# Patient Record
Sex: Male | Born: 1955 | Race: Black or African American | Hispanic: No | Marital: Single | Smoking: Never smoker
Health system: Southern US, Community
[De-identification: ages and names within clinical notes are randomized; demographics above are authoritative.]

---

## 1998-02-02 ENCOUNTER — Ambulatory Visit (HOSPITAL_COMMUNITY): Admission: RE | Admit: 1998-02-02 | Discharge: 1998-02-02 | Payer: Self-pay | Admitting: Family Medicine

## 1998-02-02 ENCOUNTER — Encounter: Payer: Self-pay | Admitting: Family Medicine

## 2000-05-11 ENCOUNTER — Encounter: Payer: Self-pay | Admitting: General Surgery

## 2000-05-11 ENCOUNTER — Encounter: Admission: RE | Admit: 2000-05-11 | Discharge: 2000-05-11 | Payer: Self-pay | Admitting: General Surgery

## 2007-12-16 ENCOUNTER — Encounter: Admission: RE | Admit: 2007-12-16 | Discharge: 2007-12-16 | Payer: Self-pay | Admitting: Chiropractic Medicine

## 2008-11-21 ENCOUNTER — Emergency Department (HOSPITAL_COMMUNITY): Admission: EM | Admit: 2008-11-21 | Discharge: 2008-11-22 | Payer: Self-pay | Admitting: Emergency Medicine

## 2010-05-20 ENCOUNTER — Emergency Department (HOSPITAL_COMMUNITY)
Admission: EM | Admit: 2010-05-20 | Discharge: 2010-05-20 | Payer: Self-pay | Source: Home / Self Care | Admitting: Emergency Medicine

## 2010-07-26 ENCOUNTER — Emergency Department (HOSPITAL_COMMUNITY)
Admission: EM | Admit: 2010-07-26 | Discharge: 2010-07-26 | Disposition: A | Discharge status: Home / Self Care | Payer: Self-pay | Attending: Emergency Medicine | Admitting: Emergency Medicine

## 2010-07-26 ENCOUNTER — Emergency Department (HOSPITAL_COMMUNITY): Payer: Self-pay

## 2010-07-26 DIAGNOSIS — M25569 Pain in unspecified knee: Secondary | ICD-10-CM | POA: Insufficient documentation

## 2010-07-26 DIAGNOSIS — Y929 Unspecified place or not applicable: Secondary | ICD-10-CM | POA: Insufficient documentation

## 2010-07-26 DIAGNOSIS — Y9355 Activity, bike riding: Secondary | ICD-10-CM | POA: Insufficient documentation

## 2010-07-26 DIAGNOSIS — M25579 Pain in unspecified ankle and joints of unspecified foot: Secondary | ICD-10-CM | POA: Insufficient documentation

## 2010-07-26 DIAGNOSIS — IMO0002 Reserved for concepts with insufficient information to code with codable children: Secondary | ICD-10-CM | POA: Insufficient documentation

## 2010-07-26 DIAGNOSIS — M25519 Pain in unspecified shoulder: Secondary | ICD-10-CM | POA: Insufficient documentation

## 2010-07-26 DIAGNOSIS — S7000XA Contusion of unspecified hip, initial encounter: Secondary | ICD-10-CM | POA: Insufficient documentation

## 2010-07-26 DIAGNOSIS — M25559 Pain in unspecified hip: Secondary | ICD-10-CM | POA: Insufficient documentation

## 2010-07-26 DIAGNOSIS — M79609 Pain in unspecified limb: Secondary | ICD-10-CM | POA: Insufficient documentation

## 2010-08-15 LAB — POCT I-STAT, CHEM 8
BUN: 11 mg/dL (ref 6–23)
Calcium, Ion: 1.07 mmol/L — ABNORMAL LOW (ref 1.12–1.32)
Chloride: 104 mEq/L (ref 96–112)
Creatinine, Ser: 1.5 mg/dL (ref 0.4–1.5)
Glucose, Bld: 93 mg/dL (ref 70–99)
Potassium: 3.9 mEq/L (ref 3.5–5.1)

## 2010-09-12 LAB — ETHANOL: Alcohol, Ethyl (B): 217 mg/dL — ABNORMAL HIGH (ref 0–10)

## 2014-01-21 ENCOUNTER — Emergency Department (HOSPITAL_COMMUNITY)
Admission: EM | Admit: 2014-01-21 | Discharge: 2014-01-21 | Disposition: A | Discharge status: Home / Self Care | Payer: No Typology Code available for payment source | Attending: Emergency Medicine | Admitting: Emergency Medicine

## 2014-01-21 ENCOUNTER — Emergency Department (HOSPITAL_COMMUNITY): Payer: No Typology Code available for payment source

## 2014-01-21 DIAGNOSIS — S52599A Other fractures of lower end of unspecified radius, initial encounter for closed fracture: Secondary | ICD-10-CM | POA: Diagnosis not present

## 2014-01-21 DIAGNOSIS — S52502A Unspecified fracture of the lower end of left radius, initial encounter for closed fracture: Secondary | ICD-10-CM

## 2014-01-21 DIAGNOSIS — Y9389 Activity, other specified: Secondary | ICD-10-CM | POA: Diagnosis not present

## 2014-01-21 DIAGNOSIS — S6990XA Unspecified injury of unspecified wrist, hand and finger(s), initial encounter: Secondary | ICD-10-CM | POA: Diagnosis present

## 2014-01-21 DIAGNOSIS — W208XXA Other cause of strike by thrown, projected or falling object, initial encounter: Secondary | ICD-10-CM | POA: Diagnosis not present

## 2014-01-21 DIAGNOSIS — S59909A Unspecified injury of unspecified elbow, initial encounter: Secondary | ICD-10-CM | POA: Insufficient documentation

## 2014-01-21 DIAGNOSIS — S59919A Unspecified injury of unspecified forearm, initial encounter: Secondary | ICD-10-CM

## 2014-01-21 DIAGNOSIS — Y929 Unspecified place or not applicable: Secondary | ICD-10-CM | POA: Diagnosis not present

## 2014-01-21 MED ORDER — HYDROCODONE-ACETAMINOPHEN 5-325 MG PO TABS: 1.0000 | ORAL_TABLET | ORAL | Status: AC | PRN

## 2014-01-21 MED ORDER — HYDROCODONE-ACETAMINOPHEN 5-325 MG PO TABS
2.0000 | ORAL_TABLET | Freq: Once | ORAL | Status: AC
  Administered 2014-01-21: 2 via ORAL
  Filled 2014-01-21: qty 2

## 2014-01-21 NOTE — ED Notes (Signed)
MD and PA at bedside for re evaluation Ortho tech at bedside

## 2014-01-21 NOTE — ED Notes (Signed)
Fracture care discussed as well as narcotic precautions

## 2014-01-21 NOTE — Discharge Instructions (Signed)
Take Hydrocodone 1-2 pills every 4-6 hours as needed for pain.  Follow up with Hand Surgery.     Forearm Fracture Your caregiver has diagnosed you as having a broken bone (fracture) of the forearm. This is the part of your arm between the elbow and your wrist. Your forearm is made up of two bones. These are the radius and ulna. A fracture is a break in one or both bones. A cast or splint is used to protect and keep your injured bone from moving. The cast or splint will be on generally for about 5 to 6 weeks, with individual variations. HOME CARE INSTRUCTIONS   Keep the injured part elevated while sitting or lying down. Keeping the injury above the level of your heart (the center of the chest). This will decrease swelling and pain.  Apply ice to the injury for 15-20 minutes, 03-04 times per day while awake, for 2 days. Put the ice in a plastic bag and place a thin towel between the bag of ice and your cast or splint.  If you have a plaster or fiberglass cast:  Do not try to scratch the skin under the cast using sharp or pointed objects.  Check the skin around the cast every day. You may put lotion on any red or sore areas.  Keep your cast dry and clean.  If you have a plaster splint:  Wear the splint as directed.  You may loosen the elastic around the splint if your fingers become numb, tingle, or turn cold or blue.  Do not put pressure on any part of your cast or splint. It may break. Rest your cast only on a pillow the first 24 hours until it is fully hardened.  Your cast or splint can be protected during bathing with a plastic bag. Do not lower the cast or splint into water.  Only take over-the-counter or prescription medicines for pain, discomfort, or fever as directed by your caregiver. SEEK IMMEDIATE MEDICAL CARE IF:   Your cast gets damaged or breaks.  You have more severe pain or swelling than you did before the cast.  Your skin or nails below the injury turn blue or gray,  or feel cold or numb.  There is a bad smell or new stains and/or pus like (purulent) drainage coming from under the cast. MAKE SURE YOU:   Understand these instructions.  Will watch your condition.  Will get help right away if you are not doing well or get worse. Document Released: 05/19/2000 Document Revised: 08/14/2011 Document Reviewed: 01/09/2008 [facility] Patient Information 2015 [place]ollsExitCare, [place]LL[place]. This information is not intended to replace advice given to you by your health care provider. Make sure you discuss any questions you have with your health care provider.

## 2014-01-21 NOTE — ED Notes (Addendum)
Pt reports a larger mower fell on his left hand/wrist. pts left hand is noticeable swollen, reports wrist pain 10/10, able to wiggle fingers slightly. bil radial pulse strong. Pt also has quarter size knot on top of right hand.  Pt reports he has had left middle finger pain before this injury.

## 2014-01-21 NOTE — ED Provider Notes (Signed)
CSN: [id number]     Arrival date & time 01/21/14  1415 History  This chart was scribed for non-physician practitioner, Ladona Mow, PA-C working with Gerhard Munch, MD by Greggory Stallion, ED scribe. This patient was seen in room WTR8/WTR8 and the patient's care was started at 2:33 PM.   Chief Complaint  Patient presents with  . Wrist Injury   The history is provided by the patient. No language interpreter was used.   HPI Comments: Justin Ramos is a 58 y.o. male who presents to the Emergency Department complaining of left wrist injury that occurred yesterday morning around 8 AM. States the deck of a large lawnmower fell onto his hand. He had sudden onset, worsening pain with associated swelling. Making a fist and moving his wrist worsens the pain. Denies numbness or tingling. Pt smokes cigarettes daily. Denies history of diabetes. Pt reports history of problems with his left middle finger. Patient states his hand was not trapped under the lawnmower, and it was easily removed off of his wrist.  No past medical history on file. No past surgical history on file. No family history on file. History  Substance Use Topics  . Smoking status: Not on file  . Smokeless tobacco: Not on file  . Alcohol Use: Not on file    Review of Systems  Musculoskeletal: Positive for arthralgias and joint swelling.  Neurological: Negative for numbness.  All other systems reviewed and are negative.  Allergies  Review of patient's allergies indicates no known allergies.  Home Medications   Prior to Admission medications   Medication Sig Start Date End Date Taking? Authorizing Provider  HYDROcodone-acetaminophen (NORCO/VICODIN) 5-325 MG per tablet Take 1-2 tablets by mouth every 4 (four) hours as needed for moderate pain or severe pain. 01/21/14   Monte Fantasia, PA-C   BP 149/95  Pulse 93  Temp(Src) 98.4 F (36.9 C) (Oral)  Resp 16  SpO2 98%  Physical Exam  Nursing note and vitals  reviewed. Constitutional: He is oriented to person, place, and time. He appears well-developed and well-nourished. No distress.  HENT:  Head: Normocephalic and atraumatic.  Eyes: Conjunctivae and EOM are normal.  Neck: Neck supple. No tracheal deviation present.  Cardiovascular: Normal rate.   Pulmonary/Chest: Effort normal. No respiratory distress.  Musculoskeletal:       Left wrist: He exhibits decreased range of motion, tenderness, bony tenderness and swelling. He exhibits no crepitus, no deformity and no laceration.  Moderate swelling noted to left hand, left wrist. No erythema, ecchymosis, cyanosis, signs of compartment syndrome. Patient is able to flex and extend all 5 digits, with limited range of motion due to to pain and swelling. Distal cap refill less than 2 seconds. Radial pulse 2+. Distal sensation intact. Patient has pain with range of motion flexion extension, abduction adduction of his wrist. Patient has severe pain with supination of his wrist.  Neurological: He is alert and oriented to person, place, and time. He has normal strength. No sensory deficit.  Skin: Skin is warm and dry.  Psychiatric: He has a normal mood and affect. His behavior is normal.    ED Course  Procedures (including critical care time)  DIAGNOSTIC STUDIES: Oxygen Saturation is 98% on RA, normal by my interpretation.    COORDINATION OF CARE: 2:37 PM-Discussed treatment plan which includes xray and pain medication with pt at bedside and pt agreed to plan.   Labs Review Labs Reviewed - No data to display  Imaging Review Dg Wrist Complete  Left  01/21/2014   CLINICAL DATA:  Hand and wrist pain and swelling following trauma.  EXAM: LEFT WRIST - COMPLETE 3+ VIEW  COMPARISON:  Left hand film of today's date  FINDINGS: The bones are mildly osteopenic. There is irregularity of the mineralization of the distal radial metaphysis in there is an avulsion from its dorsal surface. The findings are consistent with  an impacted fracture. As well as the avulsion. The adjacent ulna is intact. The carpal bones and the proximal metacarpals are intact.  IMPRESSION: The patient has sustained an acute impacted fracture with dorsal avulsion of the distal left radial metaphysis.   Electronically Signed   By: David  Swazil[place]Jordan   On: 01/21/2014 15:02   Dg Hand Complete Left  01/21/2014   CLINICAL DATA:  Status post blunt trauma to the left wrist and hand  EXAM: LEFT HAND - COMPLETE 3+ VIEW  COMPARISON:  Left wrist series of today's date  FINDINGS: The bones are mildly osteopenic. There is mild degenerative change of the second and third metacarpophalangeal joints with minimal interphalangeal joint changes at multiple sites. There is no acute fracture nor dislocation. The carpometacarpal joints are normal. There is a fracture of the dorsal aspect of the distal radius which is likely avulsion type in nature. The distal ulna is intact.  IMPRESSION: There is an avulsion from the dorsal aspect of the distal left radial metaphysis. No fracture of the carpal bones or bones of the hand are demonstrated. 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   Electronically Signed   By: David  [place]   On: 01/21/2014 15:00     EKG Interpretation None      MDM   Final diagnoses:  Fracture of radius, distal, left, closed, initial encounter    58 year old male complaining of left wrist pain after a riding lawn mower deck fell on his hand. Workup to include radiographs of patient's left wrist and pain management.  3:00 PM: Radiograph of left wrist return with impression of sustained acute impacted fracture with dorsal avulsion of the distal left radial metaphysis. We will have [facility] place a volar splint on patient's left forearm.  4:00 PM: Patient reports his pain is controlled at this time. [facility] place a volar splint. We will discharge patient and have him follow with Dr. Mina MarbleWeingold with hand surgery for further evaluation. We encouraged  patient to call or return to the ER should his symptoms worsen, should he develop any swelling, numbness, tingling, loss of function of his hand.    Signed,  Ladona MowJoe Joseguadalupe Stan, PA-C 9:09 PM   I personally performed the services described in this documentation, which was scribed in my presence. The recorded information has been reviewed and is accurate.  Monte FantasiaJoseph W Kortne All, PA-C 01/21/14 2110

## 2014-01-22 NOTE — ED Provider Notes (Signed)
  This was a shared visit with a mid-level provided (NP or PA).  Throughout the patient's course I was available for consultation/collaboration.  I saw the ECG (if appropriate), relevant labs and studies - I agree with the interpretation.  On my exam the patient was in no distress.  However he had impressive swelling about the dorsum of his left hand. Patient is distally neurovascularly intact.      Gerhard Munchobert Benjamin Merrihew, MD 01/22/14 [phone]

## 2014-11-09 ENCOUNTER — Emergency Department (HOSPITAL_COMMUNITY)
Admission: EM | Admit: 2014-11-09 | Discharge: 2014-11-09 | Disposition: A | Discharge status: Home / Self Care | Payer: No Typology Code available for payment source | Attending: Emergency Medicine | Admitting: Emergency Medicine

## 2014-11-09 ENCOUNTER — Encounter (HOSPITAL_COMMUNITY): Payer: Self-pay | Admitting: Emergency Medicine

## 2014-11-09 DIAGNOSIS — K047 Periapical abscess without sinus: Secondary | ICD-10-CM | POA: Insufficient documentation

## 2014-11-09 DIAGNOSIS — K029 Dental caries, unspecified: Secondary | ICD-10-CM | POA: Insufficient documentation

## 2014-11-09 MED ORDER — IBUPROFEN 600 MG PO TABS: 600.0000 mg | ORAL_TABLET | Freq: Three times a day (TID) | ORAL | Status: AC

## 2014-11-09 MED ORDER — PENICILLIN V POTASSIUM 500 MG PO TABS: 500.0000 mg | ORAL_TABLET | Freq: Four times a day (QID) | ORAL | Status: AC

## 2014-11-09 MED ORDER — IBUPROFEN 200 MG PO TABS
600.0000 mg | ORAL_TABLET | Freq: Once | ORAL | Status: AC
  Administered 2014-11-09: 600 mg via ORAL
  Filled 2014-11-09: qty 3

## 2014-11-09 MED ORDER — PENICILLIN V POTASSIUM 500 MG PO TABS
500.0000 mg | ORAL_TABLET | Freq: Once | ORAL | Status: AC
  Administered 2014-11-09: 500 mg via ORAL
  Filled 2014-11-09: qty 1

## 2014-11-09 NOTE — Discharge Instructions (Signed)
Dental Abscess A dental abscess is a collection of infected fluid (pus) from a bacterial infection in the inner part of the tooth (pulp). It usually occurs at the end of the tooth's root.  CAUSES   Severe tooth decay.  Trauma to the tooth that allows bacteria to enter into the pulp, such as a broken or chipped tooth. SYMPTOMS   Severe pain in and around the infected tooth.  Swelling and redness around the abscessed tooth or in the mouth or face.  Tenderness.  Pus drainage.  Bad breath.  Bitter taste in the mouth.  Difficulty swallowing.  Difficulty opening the mouth.  Nausea.  Vomiting.  Chills.  Swollen neck glands. DIAGNOSIS   A medical and dental history will be taken.  An examination will be performed by tapping on the abscessed tooth.  X-rays may be taken of the tooth to identify the abscess. TREATMENT The goal of treatment is to eliminate the infection. You may be prescribed antibiotic medicine to stop the infection from spreading. A root canal may be performed to save the tooth. If the tooth cannot be saved, it may be pulled (extracted) and the abscess may be drained.  HOME CARE INSTRUCTIONS  Only take over-the-counter or prescription medicines for pain, fever, or discomfort as directed by your caregiver.  Rinse your mouth (gargle) often with salt water ( tsp salt in 8 oz [250 ml] of warm water) to relieve pain or swelling.  Do not drive after taking pain medicine (narcotics).  Do not apply heat to the outside of your face.  Return to your dentist for further treatment as directed. SEEK MEDICAL CARE IF:  Your pain is not helped by medicine.  Your pain is getting worse instead of better. SEEK IMMEDIATE MEDICAL CARE IF:  You have a fever or persistent symptoms for more than 2-3 days.  You have a fever and your symptoms suddenly get worse.  You have chills or a very bad headache.  You have problems breathing or swallowing.  You have trouble  opening your mouth.  You have swelling in the neck or around the eye. Document Released: 05/22/2005 Document Revised: 02/14/2012 Document Reviewed: 08/30/2010 [facility] Patient Information 2015 [place]Care, [place]LLC. This information is not intended to replace advice given to you by your health care provider. Make sure you discuss any questions you have with your health care provider. Been given the name of a dentist, please call and make an appointment tomorrow for your dental care.  Tell them that you referred through the emergency department and they will make every effort to see you tomorrow.  You've also been given prescriptions for pain control, as well as an anti-biotic

## 2014-11-09 NOTE — ED Notes (Signed)
Pt is c/o toothache to the left top front tooth  Pt states the tooth is loose and painful and he thinks he has infection in it  Pt states he can smell a terrible smell coming from it

## 2014-11-09 NOTE — ED Provider Notes (Signed)
CSN: [id number]     Arrival date & time 11/09/14  2059 History   None    This chart was scribed for non-physician practitioner working with Justin Munch, MD by Arlan Organ, ED Scribe. This patient was seen in room WTR6/WTR6 and the patient's care was started at 10:20 PM.   Chief Complaint  Patient presents with  . Dental Pain   The history is provided by the patient. No language interpreter was used.    HPI Comments: TARUS BRISKI is a 59 y.o. male without any pertinent past medical history who presents to the Emergency Department complaining of constant, ongoing, unchanged L upper dental pain x few weeks. Pt states the tooth is loose and had a noted a foul odor. Pain is exacerbated with palpitation and chewing. No alleviating factors at this time. No OTC medications or home remedies attempted prior to arrival. No recent fever, chills, or inability to swallow. Mr. Cothran is not currently followed by a dentist. Pt with known allergy to Sulfa Antibiotics.  History reviewed. No pertinent past medical history. History reviewed. No pertinent past surgical history. Family History  Problem Relation Age of Onset  . Diabetes Mother   . Hypertension Mother    History  Substance Use Topics  . Smoking status: Never Smoker   . Smokeless tobacco: Not on file  . Alcohol Use: Yes    Review of Systems  Constitutional: Negative for fever and chills.  HENT: Positive for dental problem.   Gastrointestinal: Negative for nausea, vomiting and abdominal pain.      Allergies  Sulfa antibiotics  Home Medications   Prior to Admission medications   Medication Sig Start Date End Date Taking? Authorizing Provider  HYDROcodone-acetaminophen (NORCO/VICODIN) 5-325 MG per tablet Take 1-2 tablets by mouth every 4 (four) hours as needed for moderate pain or severe pain. 01/21/14   Ladona Mow, PA-C  ibuprofen (ADVIL,MOTRIN) 600 MG tablet Take 1 tablet (600 mg total) by mouth 3 (three) times daily.  11/09/14   Earley Favor, NP  penicillin v potassium (VEETID) 500 MG tablet Take 1 tablet (500 mg total) by mouth 4 (four) times daily. 11/09/14   Earley Favor, NP   Triage Vitals: BP 142/78 mmHg  Pulse 110  Temp(Src) 98.3 F (36.8 C) (Oral)  Resp 18  SpO2 98%   Physical Exam  Constitutional: He is oriented to person, place, and time. He appears well-developed and well-nourished.  HENT:  Head: Normocephalic.  Mouth/Throat:    L central incisor is loose Surrounding gums with swelling Tooth decay noted surrounding L central incisor  Eyes: EOM are normal.  Neck: Normal range of motion.  Pulmonary/Chest: Effort normal.  Abdominal: He exhibits no distension.  Musculoskeletal: Normal range of motion.  Lymphadenopathy:    He has no cervical adenopathy.  Neurological: He is alert and oriented to person, place, and time.  Skin: Skin is warm.  Psychiatric: He has a normal mood and affect.  Nursing note and vitals reviewed.   ED Course  Procedures (including critical care time)  DIAGNOSTIC STUDIES: Oxygen Saturation is 98% on RA, Normal by my interpretation.    COORDINATION OF CARE: 10:20 PM-Discussed treatment plan with pt at bedside and pt agreed to plan.     Labs Review Labs Reviewed - No data to display  Imaging Review No results found.   EKG Interpretation None     Asian is extensive dental disease.  Many missing teeth.  The tooth in question is the left central incisor  which is loose with extensive gum swelling, no exudate noticed swelling is up into nare its been started on penicillin and ibuprofen and given a dental referral  MDM      I personally performed the services described in this documentation, which was scribed in my presence. The recorded information has been reviewed and is accurate.  Earley FavorGail Terence Googe, NP 11/09/14 [id number]  Justin Munchobert Lockwood, MD 11/09/14 [phone]

## 2015-08-31 IMAGING — CR DG HAND COMPLETE 3+V*L*
3 series · 3 of 3 positions shown · non-contrast
Comparison: Left wrist series of today's date

CLINICAL DATA: Status post blunt trauma to the left wrist and hand

EXAM:
LEFT HAND - COMPLETE 3+ VIEW

[x hand pa left]
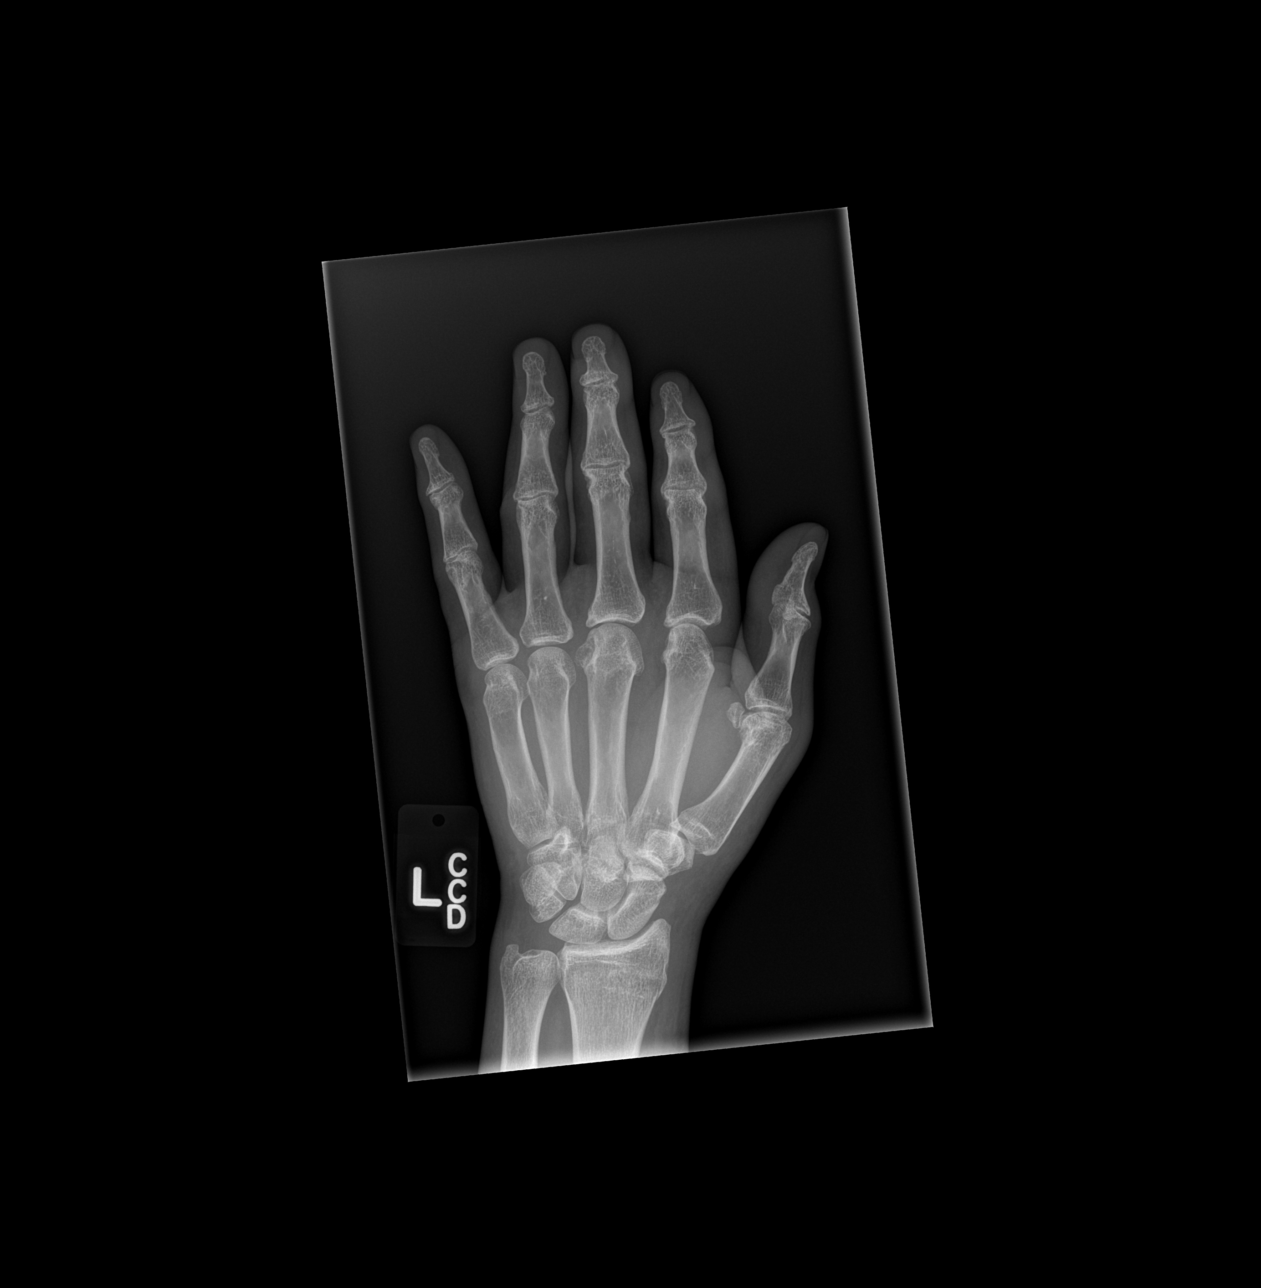

[x hand obl left]
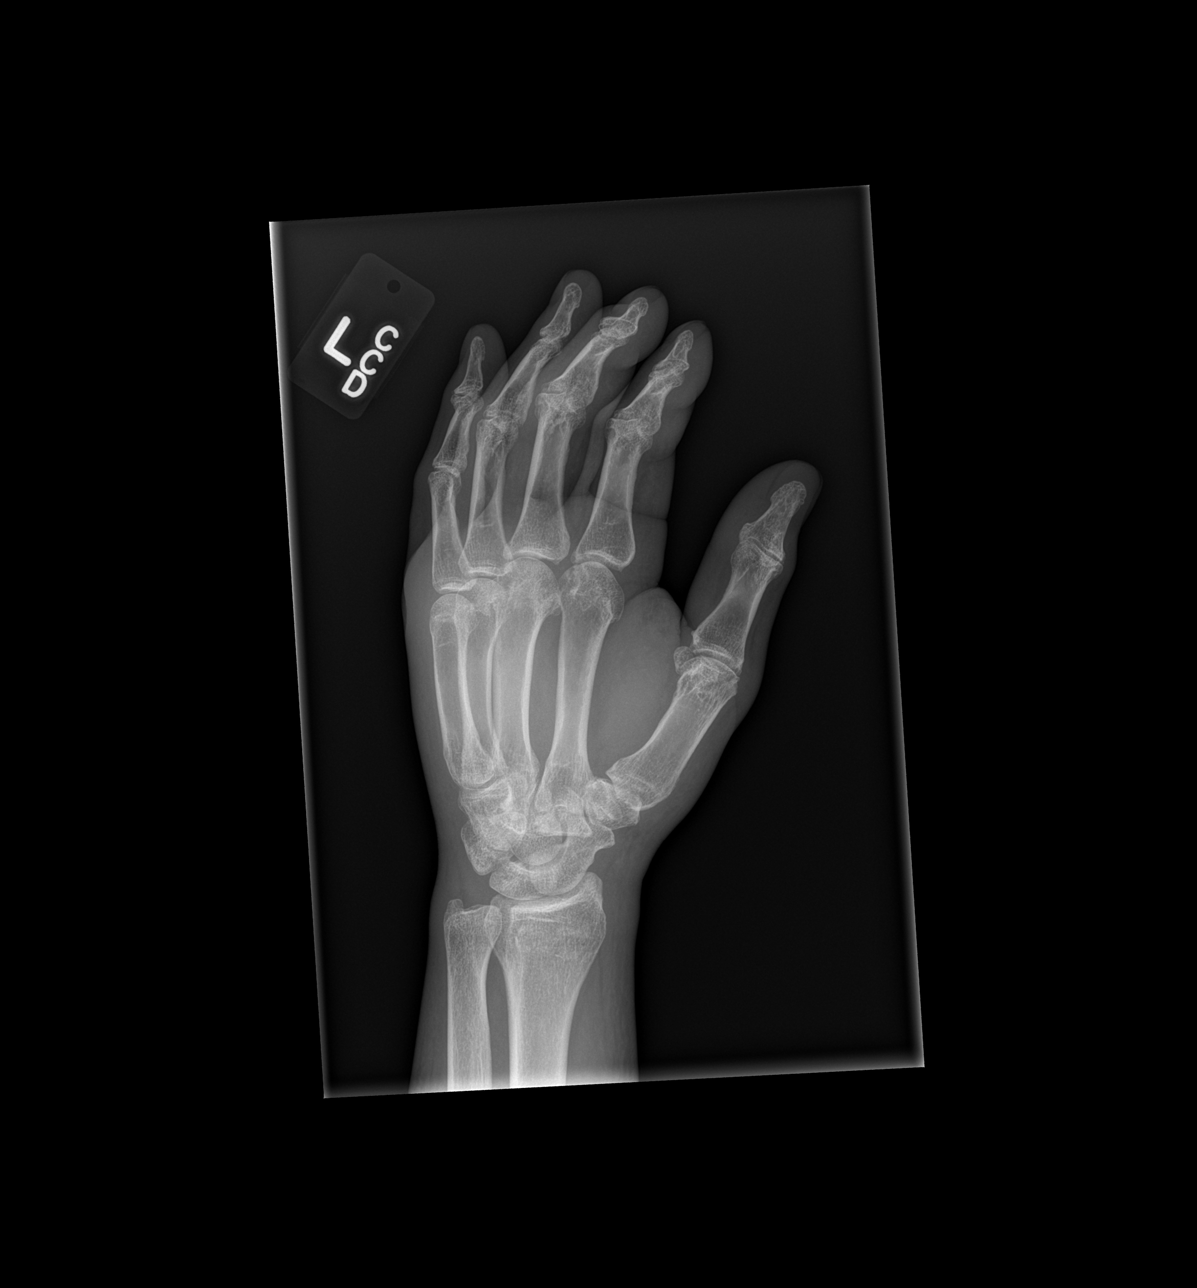

[x hand lat left]
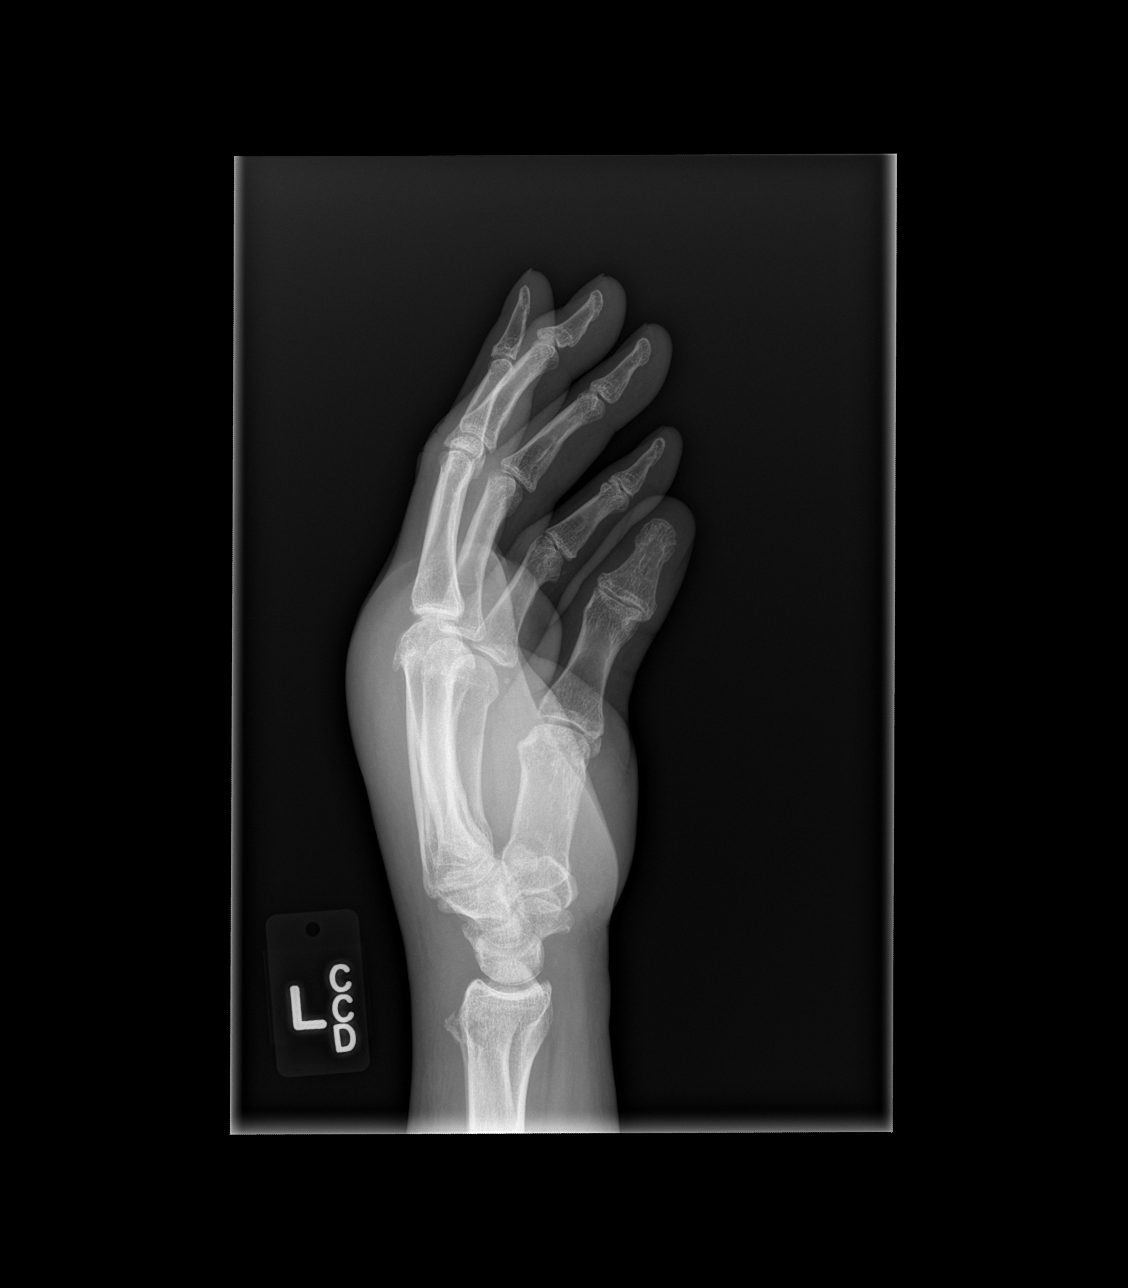

[3 of 3 positions shown; findings below may reference images not displayed]

FINDINGS: The bones are mildly osteopenic. There is mild degenerative change
of the second and third metacarpophalangeal joints with minimal
interphalangeal joint changes at multiple sites. There is no acute
fracture nor dislocation. The carpometacarpal joints are normal.
There is a fracture of the dorsal aspect of the distal radius which
is likely avulsion type in nature. The distal ulna is intact.
IMPRESSION: There is an avulsion from the dorsal aspect of the distal left
radial metaphysis. No fracture of the carpal bones or bones of the
hand are demonstrated. There are mild osteoarthritic changes.

## 2016-05-12 ENCOUNTER — Emergency Department (HOSPITAL_COMMUNITY): Payer: Self-pay

## 2016-05-12 ENCOUNTER — Encounter (HOSPITAL_COMMUNITY): Payer: Self-pay | Admitting: Emergency Medicine

## 2016-05-12 ENCOUNTER — Emergency Department (HOSPITAL_COMMUNITY)
Admission: EM | Admit: 2016-05-12 | Discharge: 2016-05-12 | Disposition: A | Discharge status: Home / Self Care | Payer: Self-pay | Attending: Emergency Medicine | Admitting: Emergency Medicine

## 2016-05-12 DIAGNOSIS — Y939 Activity, unspecified: Secondary | ICD-10-CM | POA: Insufficient documentation

## 2016-05-12 DIAGNOSIS — S46911A Strain of unspecified muscle, fascia and tendon at shoulder and upper arm level, right arm, initial encounter: Secondary | ICD-10-CM | POA: Insufficient documentation

## 2016-05-12 DIAGNOSIS — M7062 Trochanteric bursitis, left hip: Secondary | ICD-10-CM | POA: Insufficient documentation

## 2016-05-12 DIAGNOSIS — M158 Other polyosteoarthritis: Secondary | ICD-10-CM

## 2016-05-12 DIAGNOSIS — Y999 Unspecified external cause status: Secondary | ICD-10-CM | POA: Insufficient documentation

## 2016-05-12 DIAGNOSIS — M159 Polyosteoarthritis, unspecified: Secondary | ICD-10-CM | POA: Insufficient documentation

## 2016-05-12 DIAGNOSIS — W228XXA Striking against or struck by other objects, initial encounter: Secondary | ICD-10-CM | POA: Insufficient documentation

## 2016-05-12 DIAGNOSIS — Y929 Unspecified place or not applicable: Secondary | ICD-10-CM | POA: Insufficient documentation

## 2016-05-12 MED ORDER — MELOXICAM 15 MG PO TABS: 15.0000 mg | ORAL_TABLET | Freq: Every day | ORAL | 0 refills | Status: AC

## 2016-05-12 MED ORDER — NAPROXEN 500 MG PO TABS
500.0000 mg | ORAL_TABLET | Freq: Once | ORAL | Status: AC
  Administered 2016-05-12: 500 mg via ORAL
  Filled 2016-05-12: qty 1

## 2016-05-12 NOTE — Discharge Instructions (Signed)
Rest your shoulder and hip for the next 2 days but then begin performing gentle range of motion exercises. Alternate between ice and heat on your shoulder and hip throughout the day, using an ice/heat pack for 20 minutes at a time every hour. Use mobic daily to help with pain, may use over the counter tylenol as well for additional pain relief. 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 Follow up with [facility] in 1-2 weeks to establish medical care. Return to the ER for changes or worsening symptoms.
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
## 2016-05-12 NOTE — ED Notes (Signed)
Patient is alert and oriented x3.  He was given DC instructions and follow up visit instructions.  Patient gave verbal understanding.  He was DC ambulatory under his own power to home.  V/S stable.  He was not showing any signs of distress on DC 

## 2016-08-22 ENCOUNTER — Encounter (HOSPITAL_COMMUNITY): Payer: Self-pay

## 2016-08-22 ENCOUNTER — Emergency Department (HOSPITAL_COMMUNITY): Payer: Self-pay

## 2016-08-22 ENCOUNTER — Emergency Department (HOSPITAL_COMMUNITY)
Admission: EM | Admit: 2016-08-22 | Discharge: 2016-08-22 | Disposition: A | Discharge status: Home / Self Care | Payer: Self-pay | Attending: Emergency Medicine | Admitting: Emergency Medicine

## 2016-08-22 DIAGNOSIS — R6883 Chills (without fever): Secondary | ICD-10-CM | POA: Insufficient documentation

## 2016-08-22 LAB — CBC
HCT: 37.2 % — ABNORMAL LOW (ref 39.0–52.0)
Hemoglobin: 13.3 g/dL (ref 13.0–17.0)
MCH: 31 pg (ref 26.0–34.0)
MCHC: 35.8 g/dL (ref 30.0–36.0)
MCV: 86.7 fL (ref 78.0–100.0)
PLATELETS: 202 10*3/uL (ref 150–400)
RBC: 4.29 MIL/uL (ref 4.22–5.81)
RDW: 12.5 % (ref 11.5–15.5)
WBC: 3.7 10*3/uL — ABNORMAL LOW (ref 4.0–10.5)

## 2016-08-22 LAB — BASIC METABOLIC PANEL
ANION GAP: 10 (ref 5–15)
BUN: 10 mg/dL (ref 6–20)
CALCIUM: 9.1 mg/dL (ref 8.9–10.3)
CO2: 25 mmol/L (ref 22–32)
CREATININE: 1.02 mg/dL (ref 0.61–1.24)
Chloride: 100 mmol/L — ABNORMAL LOW (ref 101–111)
Glucose, Bld: 88 mg/dL (ref 65–99)
Potassium: 4.2 mmol/L (ref 3.5–5.1)
SODIUM: 135 mmol/L (ref 135–145)

## 2016-08-22 LAB — CK: Total CK: 210 U/L (ref 49–397)

## 2016-08-22 LAB — I-STAT TROPONIN, ED: TROPONIN I, POC: 0.01 ng/mL (ref 0.00–0.08)

## 2016-08-22 NOTE — ED Triage Notes (Signed)
Patient complains of generalized body aches, chills, chest pain x 1 week. Denies fever. On arrival alert and oriented, NAD. Denies cold and cough

## 2016-08-22 NOTE — ED Provider Notes (Signed)
[facility]-EMERGENCY DEPT Provider Note   CSN: [id number] Arrival date & time: 08/22/16  1425   By signing my name below, I, Justin Ramos, attest that this documentation has been prepared under the direction and in the presence of Justin Memos, MD . Electronically Signed: Freida Ramos, Scribe. 08/22/2016. 6:07 PM.  History   Chief Complaint Chief Complaint  Patient presents with  . chills, body aches     The history is provided by the patient. No language interpreter was used.     HPI Comments:  Justin Ramos is a 61 y.o. male who presents to the Emergency Department complaining of  frequent episodes of chills x 1 week. An episode can last a few hours at a time. He states he feels the chills in his BLE, BUE, and chest. Denies nausea, vomiting, diarrhea, constipation, back pain, recent weight loss, and change in appetite. Pt also notes HA with mild left sided throbbing pain and left sided CP s/p recent mechanical fall on ice. Pt drinks beer most days but denies abrupt stop. He is also a smoker but states he hasn't smoked in a few days. Pt admits to occasional crack use; last used ~ 1 week ago. No alleviating factors noted.    History reviewed. No pertinent past medical history.  There are no active problems to display for this patient.   History reviewed. No pertinent surgical history.     Home Medications    Prior to Admission medications   Medication Sig Start Date End Date Taking? Authorizing Provider  HYDROcodone-acetaminophen (NORCO/VICODIN) 5-325 MG per tablet Take 1-2 tablets by mouth every 4 (four) hours as needed for moderate pain or severe pain. 01/21/14   Ladona Mow, PA-C  ibuprofen (ADVIL,MOTRIN) 600 MG tablet Take 1 tablet (600 mg total) by mouth 3 (three) times daily. 11/09/14   Earley Favor, NP  meloxicam (MOBIC) 15 MG tablet Take 1 tablet (15 mg total) by mouth daily. TAKE WITH MEALS 05/12/16   Mercedes Street, PA-C  penicillin v potassium (VEETID) 500 MG tablet  Take 1 tablet (500 mg total) by mouth 4 (four) times daily. 11/09/14   Earley Favor, NP    Family History Family History  Problem Relation Age of Onset  . Diabetes Mother   . Hypertension Mother     Social History Social History  Substance Use Topics  . Smoking status: Never Smoker  . Smokeless tobacco: Never Used  . Alcohol use Yes     Allergies   Sulfa antibiotics   Review of Systems Review of Systems  Constitutional: Positive for chills. Negative for appetite change and unexpected weight change.  Cardiovascular: Positive for chest pain.  Gastrointestinal: Negative for constipation, diarrhea and vomiting.  Neurological: Positive for headaches.     Physical Exam Updated Vital Signs BP (!) 160/99 (BP Location: Right Arm)   Pulse 77   Temp 98.4 F (36.9 C) (Oral)   Resp 18   Ht  (1.753 m)   Wt 160 lb (72.6 kg)   SpO2 98%   BMI 23.63 kg/m   Physical Exam  Constitutional: He is oriented to person, place, and time. He appears well-developed and well-nourished. No distress.  HENT:  Head: Normocephalic and atraumatic.  Eyes: Conjunctivae are normal.  Neck: Normal range of motion. Neck supple. No thyromegaly present.  Cardiovascular: Normal rate and regular rhythm.   Pulmonary/Chest: Effort normal and breath sounds normal. No respiratory distress. He has no wheezes.  Abdominal: He exhibits no distension.  Neurological: He  is alert and oriented to person, place, and time.  Skin: Skin is warm and dry.  Psychiatric: He has a normal mood and affect.  Nursing note and vitals reviewed.    ED Treatments / Results  DIAGNOSTIC STUDIES:  Oxygen Saturation is 98% on RA, normal by my interpretation.    COORDINATION OF CARE:  5:34 PM Discussed treatment plan with pt at bedside and pt agreed to plan.  Labs (all labs ordered are listed, but only abnormal results are displayed) Labs Reviewed  BASIC METABOLIC PANEL - Abnormal; Notable for the following:        Result Value   Chloride 100 (*)    All other components within normal limits  CBC - Abnormal; Notable for the following:    WBC 3.7 (*)    HCT 37.2 (*)    All other components within normal limits  CK  I-STAT TROPOININ, ED    EKG  EKG Interpretation  Date/Time:  Tuesday August 22 2016 15:02:26 EDT Ventricular Rate:  79 PR Interval:  154 QRS Duration: 94 QT Interval:  366 QTC Calculation: 419 R Axis:   16 Text Interpretation:  Sinus rhythm with Premature atrial complexes ST & T wave abnormality, consider inferolateral ischemia Abnormal ECG No old tracing to compare Technically poor tracing Confirmed by [facility] MD, Deacon Gadbois [phone]) on 08/22/2016 5:38:04 PM       Radiology Dg Chest 2 View  Result Date: 08/22/2016 CLINICAL DATA:  Chest pain EXAM: CHEST  2 VIEW COMPARISON:  None. FINDINGS: Normal heart size. Normal mediastinal contour. No pneumothorax. No pleural effusion. Lungs appear clear, with no acute consolidative airspace disease and no pulmonary edema. IMPRESSION: No active cardiopulmonary disease. Electronically Signed   By: Justin Ramos M.D.   On: 08/22/2016 15:40    Procedures Procedures (including critical care time)  Medications Ordered in ED Medications - No data to display   Initial Impression / Assessment and Plan / ED Course  I have reviewed the triage vital signs and the nursing notes.  Pertinent labs & imaging results that were available during my care of the patient were reviewed by me and considered in my medical decision making (see chart for details).     Patient is describing shivers. No e/o myositis. No e/o fever. Possibly early viral infection. Unsure but will increase layers of clothing, otherwise return here for new/worsening symptoms.   Final Clinical Impressions(s) / ED Diagnoses   Final diagnoses:  Chills    New Prescriptions Discharge Medication List as of 08/22/2016  7:04 PM     I personally performed the services described in this  documentation, which was scribed in my presence. The recorded information has been reviewed and is accurate.     Justin Memos, MD 08/22/16 2026

## 2016-08-22 NOTE — ED Notes (Signed)
CK added onto previous specimen draw

## 2017-12-20 IMAGING — CR DG HIP (WITH OR WITHOUT PELVIS) 2-3V*L*
3 series · 3 of 3 positions shown · non-contrast
Comparison: None.

CLINICAL DATA: Left hip pain.

EXAM:
DG HIP (WITH OR WITHOUT PELVIS) 2-3V LEFT

[t pelvis ap]
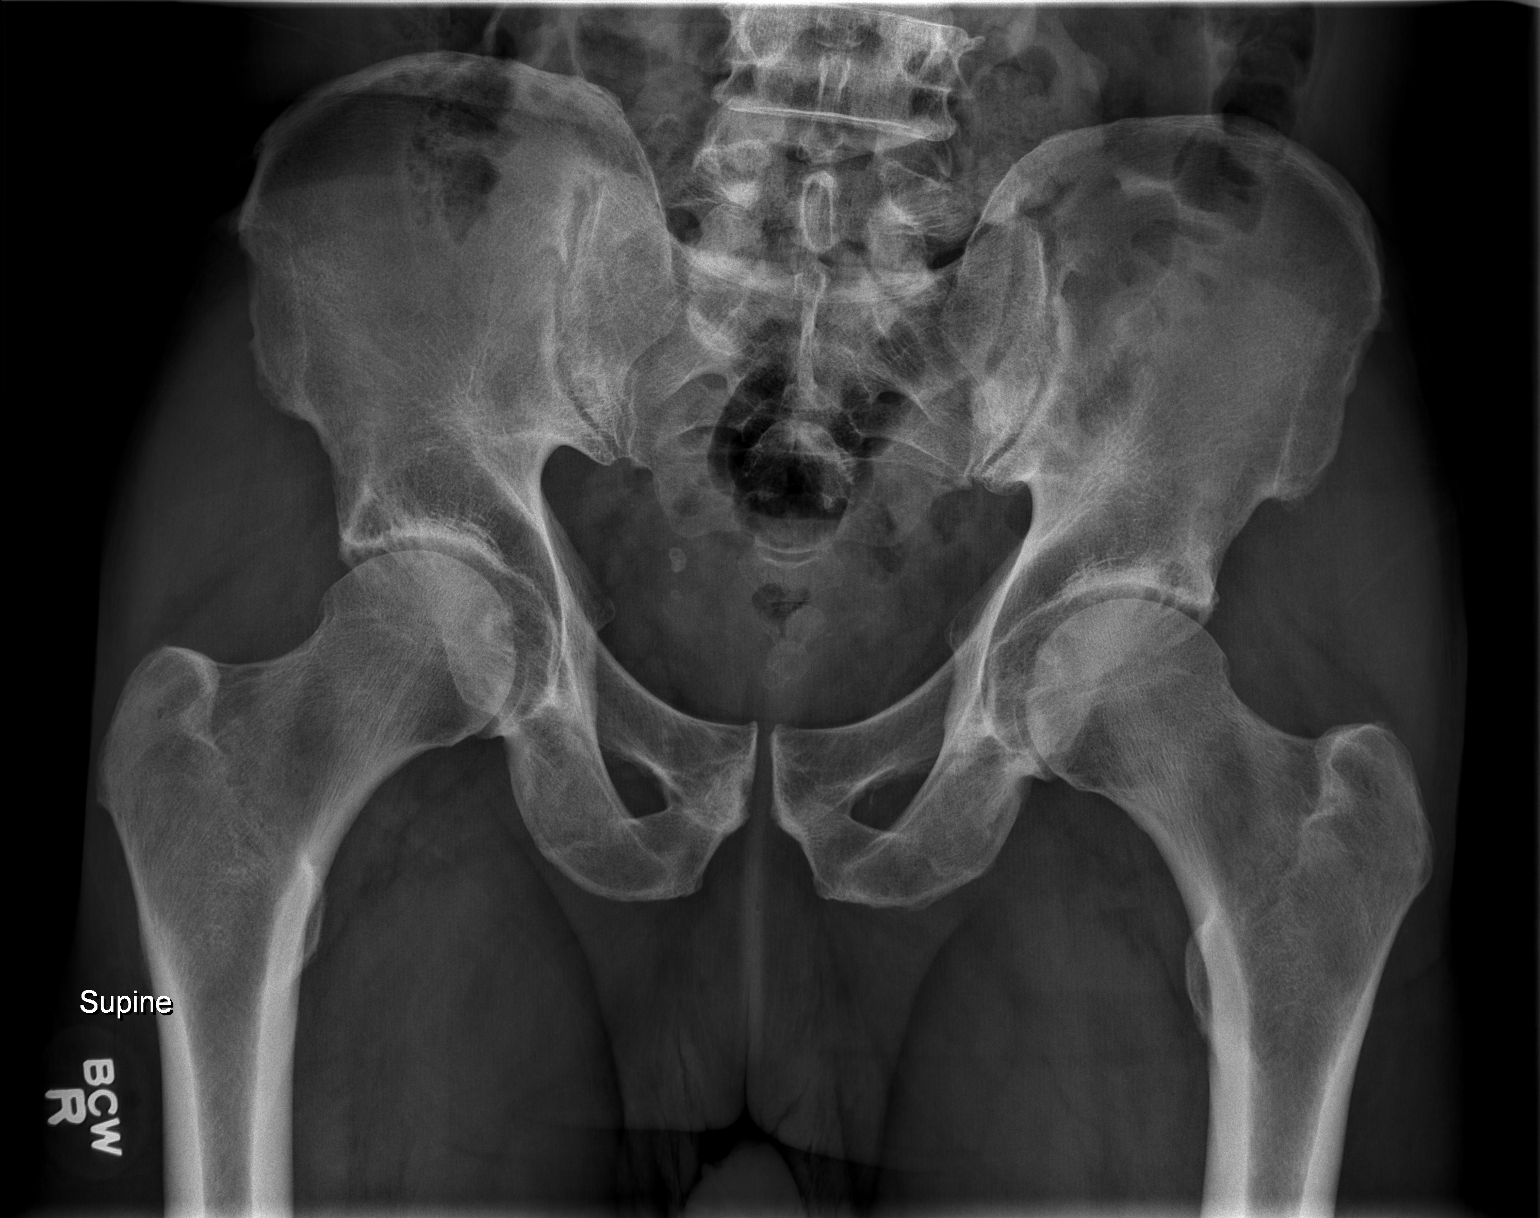

[t hip ap left]
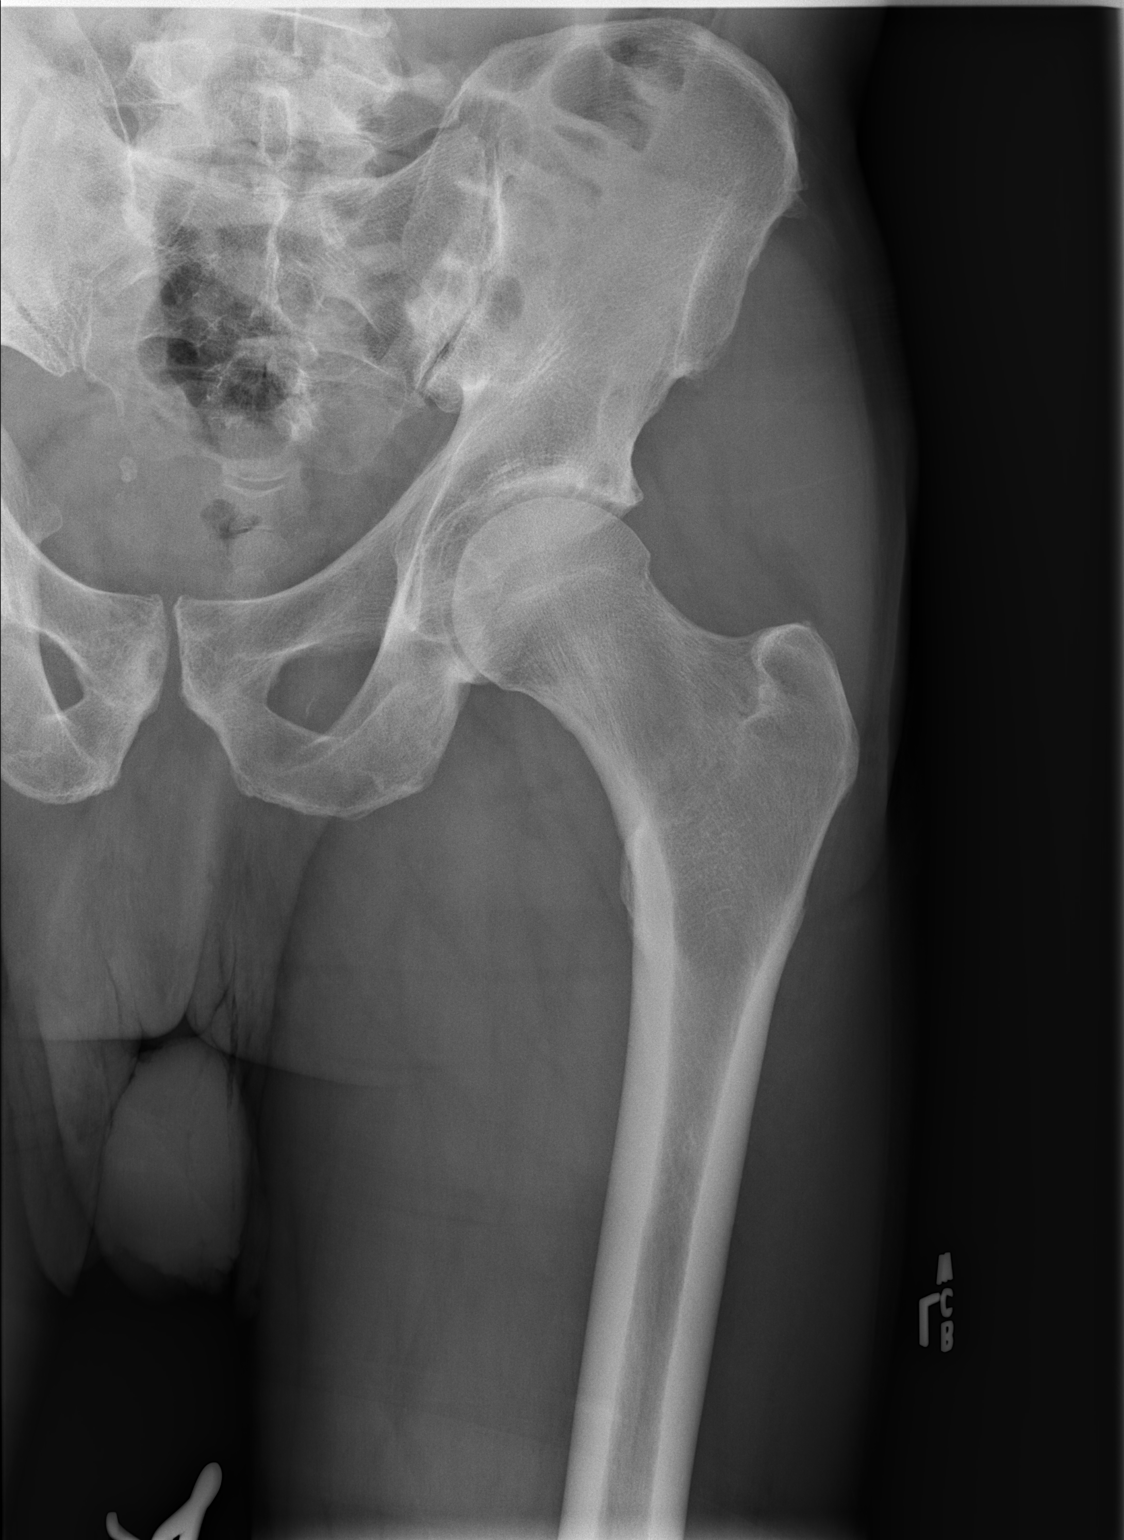

[t hip frog leg left]
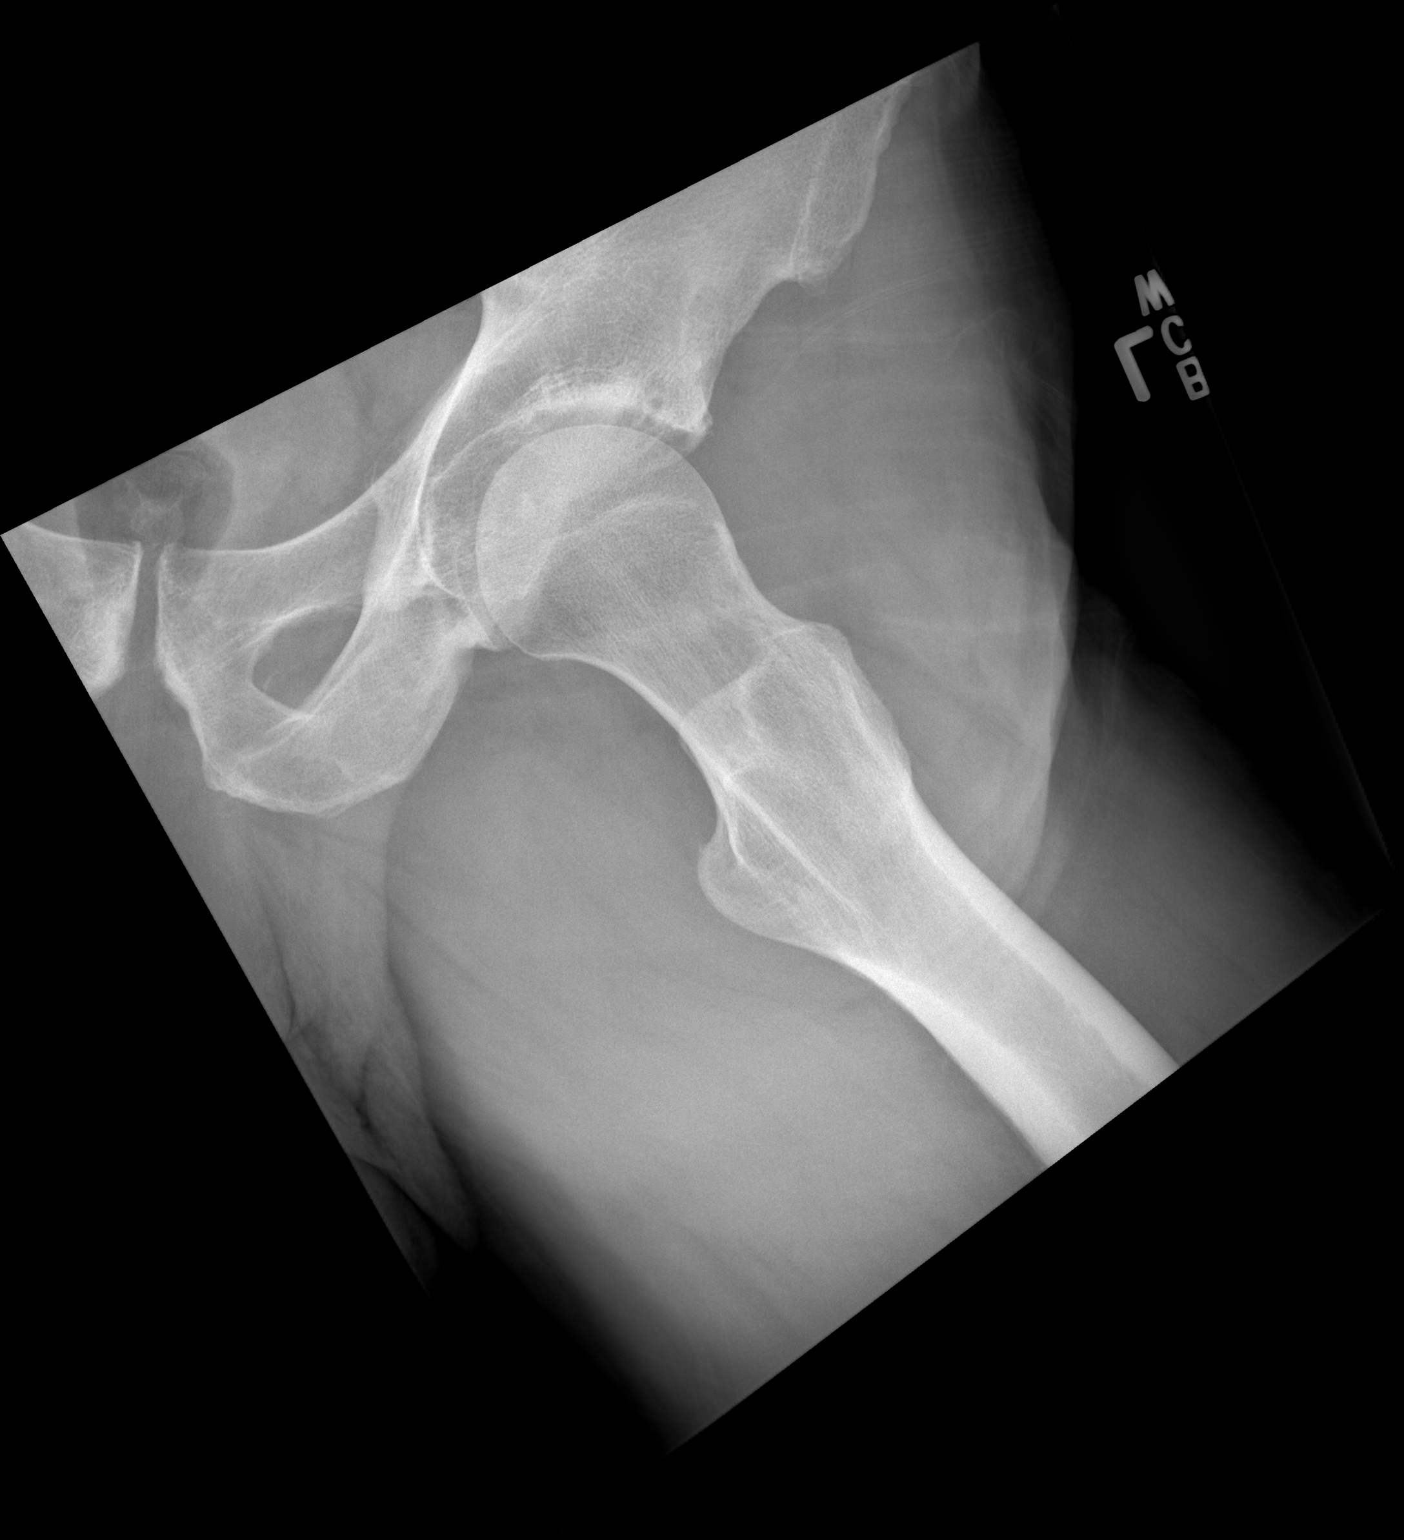

[3 of 3 positions shown; findings below may reference images not displayed]

FINDINGS: There is no fracture or dislocation. Mild osteoarthritic changes in
both hips with cystic degenerative changes at the superolateral
aspect of the acetabuli. Soft tissues are normal.
IMPRESSION: No acute abnormality. Slight arthritic changes of the left hip with
similar findings on the right.
